# Patient Record
Sex: Male | Born: 1985 | Hispanic: No | Marital: Single | Smoking: Current some day smoker
Health system: Southern US, Community
[De-identification: ages and names within clinical notes are randomized; demographics above are authoritative.]

---

## 2013-04-29 ENCOUNTER — Emergency Department (HOSPITAL_COMMUNITY)
Admission: EM | Admit: 2013-04-29 | Discharge: 2013-04-29 | Disposition: A | Discharge status: Home / Self Care | Payer: Self-pay | Attending: Emergency Medicine | Admitting: Emergency Medicine

## 2013-04-29 ENCOUNTER — Encounter (HOSPITAL_COMMUNITY): Payer: Self-pay | Admitting: Emergency Medicine

## 2013-04-29 DIAGNOSIS — F17200 Nicotine dependence, unspecified, uncomplicated: Secondary | ICD-10-CM | POA: Insufficient documentation

## 2013-04-29 DIAGNOSIS — R300 Dysuria: Secondary | ICD-10-CM | POA: Insufficient documentation

## 2013-04-29 DIAGNOSIS — R1110 Vomiting, unspecified: Secondary | ICD-10-CM | POA: Insufficient documentation

## 2013-04-29 DIAGNOSIS — K219 Gastro-esophageal reflux disease without esophagitis: Secondary | ICD-10-CM | POA: Insufficient documentation

## 2013-04-29 LAB — URINALYSIS, ROUTINE W REFLEX MICROSCOPIC
Bilirubin Urine: NEGATIVE
Glucose, UA: NEGATIVE mg/dL
Hgb urine dipstick: NEGATIVE
Ketones, ur: 15 mg/dL — AB
Protein, ur: NEGATIVE mg/dL

## 2013-04-29 LAB — CBC WITH DIFFERENTIAL/PLATELET
Basophils Absolute: 0 10*3/uL (ref 0.0–0.1)
Basophils Relative: 1 % (ref 0–1)
Eosinophils Absolute: 0.3 10*3/uL (ref 0.0–0.7)
HCT: 45.1 % (ref 39.0–52.0)
Hemoglobin: 15.8 g/dL (ref 13.0–17.0)
MCH: 30.9 pg (ref 26.0–34.0)
MCHC: 35 g/dL (ref 30.0–36.0)
Monocytes Absolute: 0.4 10*3/uL (ref 0.1–1.0)
Monocytes Relative: 7 % (ref 3–12)
RDW: 12.8 % (ref 11.5–15.5)

## 2013-04-29 LAB — COMPREHENSIVE METABOLIC PANEL
ALT: 18 U/L (ref 0–53)
AST: 21 U/L (ref 0–37)
Alkaline Phosphatase: 78 U/L (ref 39–117)
CO2: 26 mEq/L (ref 19–32)
Calcium: 9.6 mg/dL (ref 8.4–10.5)
GFR calc non Af Amer: >90 mL/min (ref >90)
Glucose, Bld: 98 mg/dL (ref 70–99)
Potassium: 4.7 mEq/L (ref 3.5–5.1)
Sodium: 138 mEq/L (ref 135–145)
Total Protein: 7.4 g/dL (ref 6.0–8.3)

## 2013-04-29 MED ORDER — PANTOPRAZOLE SODIUM 40 MG PO TBEC
40.0000 mg | DELAYED_RELEASE_TABLET | Freq: Once | ORAL | Status: AC
  Administered 2013-04-29: 40 mg via ORAL
  Filled 2013-04-29: qty 1

## 2013-04-29 MED ORDER — PANTOPRAZOLE SODIUM 20 MG PO TBEC: 20.0000 mg | DELAYED_RELEASE_TABLET | Freq: Every day | ORAL | Status: DC

## 2013-04-29 MED ORDER — GI COCKTAIL ~~LOC~~
30.0000 mL | Freq: Once | ORAL | Status: AC
  Administered 2013-04-29: 30 mL via ORAL
  Filled 2013-04-29: qty 30

## 2013-04-29 NOTE — ED Notes (Signed)
Pt c/o abd pain intermittently x 1 month after eating spicy foods or drinking coffee; pt sts vomited this am

## 2013-04-29 NOTE — ED Notes (Signed)
Pt requested dr to evaluate back pain that has been going on for about 4 years now. PA Szekalski at bedside now.

## 2013-04-29 NOTE — ED Provider Notes (Signed)
History     CSN: [id number]  Arrival date & time 04/29/13  0940   First MD Initiated Contact with Patient 04/29/13 1114      Chief Complaint  Patient presents with  . Abdominal Pain  . Emesis    (Consider location/radiation/quality/duration/timing/severity/associated sxs/prior treatment) HPI Comments: Patient is a 27 year old male with no significant past medical history who presents with a 1 month history of abdominal pain. The pain is located in the epigastrium and does not radiate. The pain is described as aching and severe when present. The pain is intermittent and precipitated by drinking coffee or eating spicy foods. No alleviating factors. The patient has tried nothing for symptoms without relief. Associated symptoms include dysuria and vomiting x1 this morning after drinking coffee. Patient denies fever, headache, NVD, chest pain, SOB, constipation. Patient reports having dysuria previously and was diagnosed with a UTI. Patient states he has also has acid reflux before which feels like his current symptoms. No abdominal surgical history.     Patient is a 27 y.o. male presenting with abdominal pain and vomiting.  Abdominal Pain Associated symptoms include abdominal pain and vomiting.  Emesis Associated symptoms: abdominal pain     History reviewed. No pertinent past medical history.  History reviewed. No pertinent past surgical history.  History reviewed. No pertinent family history.  History  Substance Use Topics  . Smoking status: Current Every Day Smoker  . Smokeless tobacco: Not on file  . Alcohol Use: Yes     Comment: occ      Review of Systems  Gastrointestinal: Positive for vomiting and abdominal pain.  All other systems reviewed and are negative.    Allergies  Review of patient's allergies indicates no known allergies.  Home Medications  No current outpatient prescriptions on file.  BP 133/74  Pulse 62  Temp(Src) 98.5 F (36.9 C) (Oral)  Resp  18  SpO2 97%  Physical Exam  Nursing note and vitals reviewed. Constitutional: He is oriented to person, place, and time. He appears well-developed and well-nourished. No distress.  HENT:  Head: Normocephalic and atraumatic.  Eyes: Conjunctivae and EOM are normal. No scleral icterus.  Neck: Normal range of motion.  Cardiovascular: Normal rate and regular rhythm.  Exam reveals no gallop and no friction rub.   No murmur heard. Pulmonary/Chest: Effort normal and breath sounds normal. He has no wheezes. He has no rales. He exhibits no tenderness.  Abdominal: Soft. He exhibits no distension. There is no tenderness. There is no rebound and no guarding.  No focal abdominal tenderness to palpation. No peritoneal signs.   Musculoskeletal: Normal range of motion.  Neurological: He is alert and oriented to person, place, and time. Coordination normal.  Speech is goal-oriented. Moves limbs without ataxia.   Skin: Skin is warm and dry.  Psychiatric: He has a normal mood and affect. His behavior is normal.    ED Course  Procedures (including critical care time)  Labs Reviewed  URINALYSIS, ROUTINE W REFLEX MICROSCOPIC - Abnormal; Notable for the following:    Ketones, ur 15 (*)    All other components within normal limits  COMPREHENSIVE METABOLIC PANEL  CBC WITH DIFFERENTIAL  LIPASE, BLOOD   No results found.   1. Acid reflux       MDM  11:46 AM Labs and urinalysis pending. Vitals stable and patient afebrile. Patient likely has acid reflux and UTI, per history provided. Will wait for labs to result to see if other process is present.  Patient will have GI cocktail and protonix now.   12:52 PM Labs unremarkable for acute changes. Urinalysis shows no signs of infections. Patient denies new sexual contacts. Patient likely has acid reflux. I will discharge the patient with prescription for daily protonix and instructions to follow up with a PCP. Patient instructed tor return with worsening  or concerning symptoms.     Emilia Beck, PA-C 04/29/13 1341

## 2013-04-30 NOTE — ED Provider Notes (Signed)
Medical screening examination/treatment/procedure(s) were performed by non-physician practitioner and as supervising physician I was immediately available for consultation/collaboration.   Carleene Cooper III, MD 04/30/13 1037

## 2015-07-30 ENCOUNTER — Emergency Department (HOSPITAL_COMMUNITY)
Admission: EM | Admit: 2015-07-30 | Discharge: 2015-07-31 | Disposition: A | Discharge status: Home / Self Care | Payer: Self-pay | Attending: Emergency Medicine | Admitting: Emergency Medicine

## 2015-07-30 DIAGNOSIS — T782XXA Anaphylactic shock, unspecified, initial encounter: Secondary | ICD-10-CM | POA: Insufficient documentation

## 2015-07-30 DIAGNOSIS — Z79899 Other long term (current) drug therapy: Secondary | ICD-10-CM | POA: Insufficient documentation

## 2015-07-30 DIAGNOSIS — Y9389 Activity, other specified: Secondary | ICD-10-CM | POA: Insufficient documentation

## 2015-07-30 DIAGNOSIS — X58XXXA Exposure to other specified factors, initial encounter: Secondary | ICD-10-CM | POA: Insufficient documentation

## 2015-07-30 DIAGNOSIS — T63484A Toxic effect of venom of other arthropod, undetermined, initial encounter: Secondary | ICD-10-CM

## 2015-07-30 DIAGNOSIS — Z720 Tobacco use: Secondary | ICD-10-CM | POA: Insufficient documentation

## 2015-07-30 DIAGNOSIS — Y9289 Other specified places as the place of occurrence of the external cause: Secondary | ICD-10-CM | POA: Insufficient documentation

## 2015-07-30 DIAGNOSIS — Y998 Other external cause status: Secondary | ICD-10-CM | POA: Insufficient documentation

## 2015-07-30 LAB — CBC
HCT: 48.5 % (ref 39.0–52.0)
HEMOGLOBIN: 17.2 g/dL — AB (ref 13.0–17.0)
MCH: 31.4 pg (ref 26.0–34.0)
MCHC: 35.5 g/dL (ref 30.0–36.0)
MCV: 88.5 fL (ref 78.0–100.0)
Platelets: 237 10*3/uL (ref 150–400)
RBC: 5.48 MIL/uL (ref 4.22–5.81)
RDW: 12.8 % (ref 11.5–15.5)
WBC: 12.7 10*3/uL — ABNORMAL HIGH (ref 4.0–10.5)

## 2015-07-30 LAB — I-STAT CHEM 8, ED
BUN: 15 mg/dL (ref 6–20)
CALCIUM ION: 1.07 mmol/L — AB (ref 1.12–1.23)
CHLORIDE: 104 mmol/L (ref 101–111)
CREATININE: 1.1 mg/dL (ref 0.61–1.24)
GLUCOSE: 134 mg/dL — AB (ref 65–99)
HCT: 51 % (ref 39.0–52.0)
Hemoglobin: 17.3 g/dL — ABNORMAL HIGH (ref 13.0–17.0)
Potassium: 2.9 mmol/L — ABNORMAL LOW (ref 3.5–5.1)
Sodium: 139 mmol/L (ref 135–145)
TCO2: 20 mmol/L (ref 0–100)

## 2015-07-30 MED ORDER — DIPHENHYDRAMINE HCL 50 MG/ML IJ SOLN
25.0000 mg | Freq: Once | INTRAMUSCULAR | Status: AC
  Administered 2015-07-30: 25 mg via INTRAVENOUS
  Filled 2015-07-30: qty 1

## 2015-07-30 MED ORDER — EPINEPHRINE 0.3 MG/0.3ML IJ SOAJ
0.3000 mg | Freq: Once | INTRAMUSCULAR | Status: AC
  Administered 2015-07-30: 0.3 mg via INTRAMUSCULAR
  Filled 2015-07-30: qty 0.3

## 2015-07-30 MED ORDER — FAMOTIDINE IN NACL 20-0.9 MG/50ML-% IV SOLN
20.0000 mg | Freq: Once | INTRAVENOUS | Status: AC
  Administered 2015-07-30: 20 mg via INTRAVENOUS

## 2015-07-30 MED ORDER — SODIUM CHLORIDE 0.9 % IV SOLN
1000.0000 mL | INTRAVENOUS | Status: DC
  Administered 2015-07-30: 1000 mL via INTRAVENOUS

## 2015-07-30 MED ORDER — METHYLPREDNISOLONE SODIUM SUCC 125 MG IJ SOLR
125.0000 mg | Freq: Once | INTRAMUSCULAR | Status: AC
  Administered 2015-07-30: 125 mg via INTRAVENOUS
  Filled 2015-07-30: qty 2

## 2015-07-30 MED ORDER — EPINEPHRINE 0.3 MG/0.3ML IJ SOAJ
0.3000 mg | Freq: Once | INTRAMUSCULAR | Status: AC
  Administered 2015-07-30: 0.3 mg via INTRAMUSCULAR

## 2015-07-30 NOTE — ED Notes (Addendum)
The pt arrivd at triage with a bee sting to his lower lip approx 30 minutes before he arrived.  On arrival generalized flush  C/o difficulty breathing .  Feeling like he was swollen in the back of his thrioat generalized hives.  Itching all over his body facial features swollen.  bp low and he is c/o sl upper lt chest pain.  Epi pen injected iv placed

## 2015-07-30 NOTE — ED Provider Notes (Signed)
CSN: [id number]     Arrival date & time 07/30/15  2040 History   First MD Initiated Contact with Patient 07/30/15 2052     Chief Complaint  Patient presents with  . Allergic Reaction   HPI Patient presents to the emergency room after a bee sting on the lip. This occurred shortly before arrival. Patient subsequently developed swelling of his lip and his eyes. He started developing itching throughout his entire body and developed redness and hives. He has been stung in the past but never has had a reaction like this. As any trouble with chest pain or shortness of breath. No trouble swallowing or breathing. No past medical history on file. No past surgical history on file. No family history on file. Social History  Substance Use Topics  . Smoking status: Current Every Day Smoker  . Smokeless tobacco: Not on file  . Alcohol Use: Yes     Comment: occ    Review of Systems  All other systems reviewed and are negative.     Allergies  Bee venom  Home Medications   Prior to Admission medications   Medication Sig Start Date End Date Taking? Authorizing Provider  pantoprazole (PROTONIX) 20 MG tablet Take 1 tablet (20 mg total) by mouth daily. 04/29/13   Kaitlyn Szekalski, PA-C   BP 118/68 mmHg  Pulse 95  Resp 25  Ht  (1.676 m)  Wt 195 lb (88.451 kg)  BMI 31.49 kg/m2  SpO2 96% Physical Exam  Constitutional: He appears well-developed and well-nourished. No distress.  HENT:  Head: Normocephalic.  Right Ear: External ear normal.  Left Ear: External ear normal.  Mouth/Throat: No oropharyngeal exudate.  No oropharyngeal edema, periorbital edema and edema of the lips  Eyes: Conjunctivae are normal. Right eye exhibits no discharge. Left eye exhibits no discharge. No scleral icterus.  Neck: Neck supple. No tracheal deviation present.  Cardiovascular: Normal rate, regular rhythm and intact distal pulses.   Pulmonary/Chest: Effort normal and breath sounds normal. No stridor. No  respiratory distress. He has no wheezes. He has no rales.  Abdominal: Soft. Bowel sounds are normal. He exhibits no distension. There is no tenderness. There is no rebound and no guarding.  Musculoskeletal: He exhibits no edema or tenderness.  Neurological: He is alert. He has normal strength. No cranial nerve deficit (no facial droop, extraocular movements intact, no slurred speech) or sensory deficit. He exhibits normal muscle tone. He displays no seizure activity. Coordination normal.  Skin: Skin is warm and dry. Rash noted.  Diffuse erythema of the skin with urticaria on his torso and extremities  Psychiatric: He has a normal mood and affect.  Nursing note and vitals reviewed.   ED Course  Procedures (including critical care time)  CRITICAL CARE Performed by: WUJWJ,XBJ Total critical care time: 40 Critical care time was exclusive of separately billable procedures and treating other patients. Critical care was necessary to treat or prevent imminent or life-threatening deterioration. Critical care was time spent personally by me on the following activities: development of treatment plan with patient and/or surrogate as well as nursing, discussions with consultants, evaluation of patient's response to treatment, examination of patient, obtaining history from patient or surrogate, ordering and performing treatments and interventions, ordering and review of laboratory studies, ordering and review of radiographic studies, pulse oximetry and re-evaluation of patient's condition.  Labs Review Labs Reviewed  CBC - Abnormal; Notable for the following:    WBC 12.7 (*)    Hemoglobin 17.2 (*)  All other components within normal limits  I-STAT CHEM 8, ED - Abnormal; Notable for the following:    Potassium 2.9 (*)    Glucose, Bld 134 (*)    Calcium, Ion 1.07 (*)    Hemoglobin 17.3 (*)    All other components within normal limits    Medications  0.9 %  sodium chloride infusion (0 mLs  Intravenous Stopped 07/30/15 2301)  EPINEPHrine (EPI-PEN) injection 0.3 mg (0.3 mg Intramuscular Given 07/30/15 2045)  famotidine (PEPCID) IVPB 20 mg premix (0 mg Intravenous Stopped 07/30/15 2058)  methylPREDNISolone sodium succinate (SOLU-MEDROL) 125 mg/2 mL injection 125 mg (125 mg Intravenous Given 07/30/15 2057)  diphenhydrAMINE (BENADRYL) injection 25 mg (25 mg Intravenous Given 07/30/15 2057)  diphenhydrAMINE (BENADRYL) injection 25 mg (25 mg Intravenous Given 07/30/15 2257)  EPINEPHrine (EPI-PEN) injection 0.3 mg (0.3 mg Intramuscular Given 07/30/15 2316)     MDM   Final diagnoses:  Anaphylaxis due to hymenoptera venom, undetermined intent, initial encounter    Pt presented with an anaphylactic reaction.  Initially given epinephrine steroids and antihistamines.  He had some improvement but developed recurrent urticaria and ithcing.  2nd dose of epi given but no airway sx.  Will continue to monitor in the ED.  Anticipate discharge.  Dr Judd Lien will follow up on the patient.    Linwood Dibbles, MD 07/31/15 [phone]

## 2015-07-30 NOTE — ED Notes (Signed)
Dr.Knapp at bedside  

## 2015-07-30 NOTE — ED Notes (Signed)
Pt was stung on lip by a bee. Pt having difficulty speaking with swollen lips and eyes.

## 2015-07-30 NOTE — ED Notes (Signed)
Epi pen given at 2045

## 2015-07-31 MED ORDER — PREDNISONE 10 MG PO TABS: 20.0000 mg | ORAL_TABLET | Freq: Two times a day (BID) | ORAL | Status: DC

## 2015-07-31 MED ORDER — EPINEPHRINE 0.3 MG/0.3ML IJ SOAJ: 0.3000 mg | Freq: Once | INTRAMUSCULAR | Status: AC

## 2015-07-31 MED ORDER — PREDNISONE 10 MG PO TABS: 20.0000 mg | ORAL_TABLET | Freq: Two times a day (BID) | ORAL | Status: AC

## 2015-07-31 NOTE — Discharge Instructions (Signed)
Prednisone as prescribed.  Benadryl 25 mg every 6 hours for the next 2 days.  EpiPen as needed if you react this way to a bee sting in the future.  Return to the ER if symptoms significantly worsen or change.   Allergies Allergies may happen from anything your body is sensitive to. This may be food, medicines, pollens, chemicals, and nearly anything around you in everyday life that produces allergens. An allergen is anything that causes an allergy producing substance. Heredity is often a factor in causing these problems. This means you may have some of the same allergies as your parents. Food allergies happen in all age groups. Food allergies are some of the most severe and life threatening. Some common food allergies are cow's milk, seafood, eggs, nuts, wheat, and soybeans. SYMPTOMS   Swelling around the mouth.  An itchy red rash or hives.  Vomiting or diarrhea.  Difficulty breathing. SEVERE ALLERGIC REACTIONS ARE LIFE-THREATENING. This reaction is called anaphylaxis. It can cause the mouth and throat to swell and cause difficulty with breathing and swallowing. In severe reactions only a trace amount of food (for example, peanut oil in a salad) may cause death within seconds. Seasonal allergies occur in all age groups. These are seasonal because they usually occur during the same season every year. They may be a reaction to molds, grass pollens, or tree pollens. Other causes of problems are house dust mite allergens, pet dander, and mold spores. The symptoms often consist of nasal congestion, a runny itchy nose associated with sneezing, and tearing itchy eyes. There is often an associated itching of the mouth and ears. The problems happen when you come in contact with pollens and other allergens. Allergens are the particles in the air that the body reacts to with an allergic reaction. This causes you to release allergic antibodies. Through a chain of events, these eventually cause you to  release histamine into the blood stream. Although it is meant to be protective to the body, it is this release that causes your discomfort. This is why you were given anti-histamines to feel better. If you are unable to pinpoint the offending allergen, it may be determined by skin or blood testing. Allergies cannot be cured but can be controlled with medicine. Hay fever is a collection of all or some of the seasonal allergy problems. It may often be treated with simple over-the-counter medicine such as diphenhydramine. Take medicine as directed. Do not drink alcohol or drive while taking this medicine. Check with your caregiver or package insert for child dosages. If these medicines are not effective, there are many new medicines your caregiver can prescribe. Stronger medicine such as nasal spray, eye drops, and corticosteroids may be used if the first things you try do not work well. Other treatments such as immunotherapy or desensitizing injections can be used if all else fails. Follow up with your caregiver if problems continue. These seasonal allergies are usually not life threatening. They are generally more of a nuisance that can often be handled using medicine. HOME CARE INSTRUCTIONS   If unsure what causes a reaction, keep a diary of foods eaten and symptoms that follow. Avoid foods that cause reactions.  If hives or rash are present:  Take medicine as directed.  You may use an over-the-counter antihistamine (diphenhydramine) for hives and itching as needed.  Apply cold compresses (cloths) to the skin or take baths in cool water. Avoid hot baths or showers. Heat will make a rash and itching worse.  If you are severely allergic:  Following a treatment for a severe reaction, hospitalization is often required for closer follow-up.  Wear a medic-alert bracelet or necklace stating the allergy.  You and your family must learn how to give adrenaline or use an anaphylaxis kit.  If you have  had a severe reaction, always carry your anaphylaxis kit or EpiPen with you. Use this medicine as directed by your caregiver if a severe reaction is occurring. Failure to do so could have a fatal outcome. SEEK MEDICAL CARE IF:  You suspect a food allergy. Symptoms generally happen within 30 minutes of eating a food.  Your symptoms have not gone away within 2 days or are getting worse.  You develop new symptoms.  You want to retest yourself or your child with a food or drink you think causes an allergic reaction. Never do this if an anaphylactic reaction to that food or drink has happened before. Only do this under the care of a caregiver. SEEK IMMEDIATE MEDICAL CARE IF:   You have difficulty breathing, are wheezing, or have a tight feeling in your chest or throat.  You have a swollen mouth, or you have hives, swelling, or itching all over your body.  You have had a severe reaction that has responded to your anaphylaxis kit or an EpiPen. These reactions may return when the medicine has worn off. These reactions should be considered life threatening. MAKE SURE YOU:   Understand these instructions.  Will watch your condition.  Will get help right away if you are not doing well or get worse. Document Released: 01/29/2003 Document Revised: 03/02/2013 Document Reviewed: 07/05/2008 [facility] Patient Information 2015 [place], [place]. This information is not intended to replace advice given to you by your health care provider. Make sure you discuss any questions you have with your health care provider.

## 2015-07-31 NOTE — ED Provider Notes (Signed)
Care assumed from Dr. Lynelle Doctor at shift change. Patient was stung by a bee and had an anaphylactic reaction. He is been given 2 doses of epinephrine along with intravenous medications. On reassessment, the patient no longer has a rash, his vitals are stable, and he is having no evidence for airway compromise. He will be discharged to home with prednisone, Benadryl, and a prescription for an EpiPen.  Jermaine Lyons, MD 07/31/15 [phone]

## 2015-08-01 ENCOUNTER — Emergency Department (HOSPITAL_COMMUNITY): Payer: Self-pay

## 2015-08-01 ENCOUNTER — Encounter (HOSPITAL_COMMUNITY): Payer: Self-pay | Admitting: Emergency Medicine

## 2015-08-01 DIAGNOSIS — Z720 Tobacco use: Secondary | ICD-10-CM | POA: Insufficient documentation

## 2015-08-01 DIAGNOSIS — R079 Chest pain, unspecified: Secondary | ICD-10-CM | POA: Insufficient documentation

## 2015-08-01 LAB — CBC
HCT: 42.9 % (ref 39.0–52.0)
Hemoglobin: 15 g/dL (ref 13.0–17.0)
MCH: 31.1 pg (ref 26.0–34.0)
MCHC: 35 g/dL (ref 30.0–36.0)
MCV: 88.8 fL (ref 78.0–100.0)
PLATELETS: 214 10*3/uL (ref 150–400)
RBC: 4.83 MIL/uL (ref 4.22–5.81)
RDW: 12.8 % (ref 11.5–15.5)
WBC: 7.4 10*3/uL (ref 4.0–10.5)

## 2015-08-01 LAB — BASIC METABOLIC PANEL
Anion gap: 10 (ref 5–15)
BUN: 13 mg/dL (ref 6–20)
CALCIUM: 8.7 mg/dL — AB (ref 8.9–10.3)
CHLORIDE: 100 mmol/L — AB (ref 101–111)
CO2: 24 mmol/L (ref 22–32)
CREATININE: 0.99 mg/dL (ref 0.61–1.24)
GFR calc non Af Amer: >60 mL/min (ref >60)
Glucose, Bld: 131 mg/dL — ABNORMAL HIGH (ref 65–99)
Potassium: 3.6 mmol/L (ref 3.5–5.1)
SODIUM: 134 mmol/L — AB (ref 135–145)

## 2015-08-01 NOTE — ED Notes (Signed)
Patient here with complaint of left chest pain starting 3 days ago. States he drank some pineapple juice, then had another drink, then the pain started. Denies history of similar.

## 2015-08-02 ENCOUNTER — Emergency Department (HOSPITAL_COMMUNITY)
Admission: EM | Admit: 2015-08-02 | Discharge: 2015-08-02 | Discharge status: Against Medical Advice | Payer: Self-pay | Attending: Emergency Medicine | Admitting: Emergency Medicine

## 2015-08-02 ENCOUNTER — Emergency Department (HOSPITAL_COMMUNITY)
Admission: EM | Admit: 2015-08-02 | Discharge: 2015-08-02 | Disposition: A | Discharge status: Home / Self Care | Payer: Self-pay | Attending: Emergency Medicine | Admitting: Emergency Medicine

## 2015-08-02 ENCOUNTER — Emergency Department (HOSPITAL_COMMUNITY): Payer: Self-pay

## 2015-08-02 ENCOUNTER — Encounter (HOSPITAL_COMMUNITY): Payer: Self-pay | Admitting: Emergency Medicine

## 2015-08-02 DIAGNOSIS — Z7952 Long term (current) use of systemic steroids: Secondary | ICD-10-CM | POA: Insufficient documentation

## 2015-08-02 DIAGNOSIS — R0789 Other chest pain: Secondary | ICD-10-CM | POA: Insufficient documentation

## 2015-08-02 DIAGNOSIS — Z720 Tobacco use: Secondary | ICD-10-CM | POA: Insufficient documentation

## 2015-08-02 LAB — I-STAT TROPONIN, ED: TROPONIN I, POC: 0 ng/mL (ref 0.00–0.08)

## 2015-08-02 LAB — BASIC METABOLIC PANEL
Anion gap: 8 (ref 5–15)
BUN: 10 mg/dL (ref 6–20)
CHLORIDE: 103 mmol/L (ref 101–111)
CO2: 23 mmol/L (ref 22–32)
CREATININE: 0.9 mg/dL (ref 0.61–1.24)
Calcium: 9 mg/dL (ref 8.9–10.3)
GFR calc Af Amer: >60 mL/min (ref >60)
GFR calc non Af Amer: >60 mL/min (ref >60)
GLUCOSE: 110 mg/dL — AB (ref 65–99)
POTASSIUM: 3.7 mmol/L (ref 3.5–5.1)
SODIUM: 134 mmol/L — AB (ref 135–145)

## 2015-08-02 LAB — CBC
HCT: 46 % (ref 39.0–52.0)
Hemoglobin: 16 g/dL (ref 13.0–17.0)
MCH: 30.8 pg (ref 26.0–34.0)
MCHC: 34.8 g/dL (ref 30.0–36.0)
MCV: 88.6 fL (ref 78.0–100.0)
Platelets: 237 10*3/uL (ref 150–400)
RBC: 5.19 MIL/uL (ref 4.22–5.81)
RDW: 12.7 % (ref 11.5–15.5)
WBC: 7.4 10*3/uL (ref 4.0–10.5)

## 2015-08-02 MED ORDER — IBUPROFEN 800 MG PO TABS: 800.0000 mg | ORAL_TABLET | Freq: Three times a day (TID) | ORAL | Status: AC

## 2015-08-02 NOTE — Discharge Instructions (Signed)
Please take Ibuprofen for pain relief as prescribed. Please follow up with a Primary care provider listed in the Resource Guide provided below in 3 days. Please return to the Emergency Department if symptoms worsen or new onset of fever, headache, shortness of breath, vomiting, weakness.   Emergency Department Resource Guide 1) Find a Doctor and Pay Out of Pocket Although you won't have to find out who is covered by your insurance plan, it is a good idea to ask around and get recommendations. You will then need to call the office and see if the doctor you have chosen will accept you as a new patient and what types of options they offer for patients who are self-pay. Some doctors offer discounts or will set up payment plans for their patients who do not have insurance, but you will need to ask so you aren't surprised when you get to your appointment.  2) Contact Your Local Health Department Not all health departments have doctors that can see patients for sick visits, but many do, so it is worth a call to see if yours does. If you don't know where your local health department is, you can check in your phone book. The CDC also has a tool to help you locate your state's health department, and many state websites also have listings of all of their local health departments.  3) Find a Walk-in Clinic If your illness is not likely to be very severe or complicated, you may want to try a walk in clinic. These are popping up all over the country in pharmacies, drugstores, and shopping centers. They're usually staffed by nurse practitioners or physician assistants that have been trained to treat common illnesses and complaints. They're usually fairly quick and inexpensive. However, if you have serious medical issues or chronic medical problems, these are probably not your best option.  No Primary Care Doctor: - Call Health Connect at  [phone] - they can help you locate a primary care doctor that  accepts your  insurance, provides certain services, etc. - Physician Referral Service- [phone]  Chronic Pain Problems: Organization         Address  Phone   Notes  [facility]  [phone] Patients need to be referred by their primary care doctor.   Medication Assistance: Organization         Address  Phone   Notes  [facility] [address] [place] [place], [place] [address] [phone] --Must be a resident of [facility] -- Must have NO insurance coverage whatsoever (no Medicaid/ Medicare, etc.) -- The pt. MUST have a primary care doctor that directs their care regularly and follows them in the community   [facility]ist  [phone]   [employer]  [phone]    Agencies that provide inexpensive medical care: Organization         Address  Phone   Notes  [facility]  [phone]   [facility]    [phone]   [facility] [address] [place], [place] [address] [phone]   [facility] [place] [address] [place] [place] [phone]   Planned Parenthood    [phone]   [facility]    [phone]   [facility]  [address], [place] Phone:  [phone], Fax:  [phone] Hours of Operation:  9 am - 6 pm,  M-F.  Also accepts Medicaid/Medicare and self-pay.  [facility] for [place], [address], [place] Phone: [phone], Fax: [phone]. Hours of Operation:  8:30 am - 5:30 pm, M-F.  Also accepts Medicaid and self-pay.  [facility] [place] [address] [place] Phone: [phone]   [place], [place], [place] [phone], Ext. [phone] Mondays & Thursdays: 7-9 AM.  First 15 patients are seen on a first come, first serve basis.    [place] Providers:  Organization          Address  Phone   Notes  [facility] [address],  [phone] Also accepts self-pay patients.  [facility] [address], [place]  [phone]   [place], [address], [place] [phone]   [facility] [address] [place] [phone]   Lucianne Lei [address], [place]   [phone] Only accepts [place] Access [place] patients after they have their name applied to their card.   Self-Pay (no insurance) in [facility]:  Organization         Address  Phone   Notes  Sickle Cell Patients, [facility] Internal Medicine [place] [phone]   [facility] [place] [phone]   [facility] [place]  [place], [place], [place] [phone]   [facility]/Dr. Osei-Bonsu  [address], [place] or [address], [place] [phone] Phone number for both [place] and [place] locations is the same.  [facility]gent [facility] and [facility] [address] [place] [phone]   [facility] [address] [place] or [address] [phone]   [facility] [address], [address] [phone], phone; [phone], fax Sees patients 1st and 3rd Saturday of every month.  Must not qualify for public or private insurance (i.e. Medicaid, Medicare, [place] Health Choice, Veterans' Benefits)  Household income should be no more than 200% of the poverty level The clinic cannot treat you if you are pregnant or think you are pregnant  Sexually transmitted diseases are not treated at the clinic.    Dental Care: Organization         Address  Phone  Notes  [facility] Department of [facility] [place] [phone] Accepts children up to age 58 who are enrolled in [place] or [place]; pregnant women with a Medicaid card; and children who have applied for Medicaid or [place] Health Choice, but were declined, whose parents can pay a reduced fee at time of service.  [facility] Department of [facility]  [address], [place] [phone] Accepts children up to age 76 who are enrolled in [place] or [place]; pregnant women with a Medicaid card; and children who have applied for Medicaid or  Health Choice, but were declined, whose parents can pay a reduced fee at time of service.  [place] Adult [facility] Access PRO[facility]  [place] [phone] Patients are seen by appointment only. Walk-ins are not accepted. [place] will see patients 22 years of age and older. Monday -  Tuesday (8am-5pm) Most Wednesdays (8:30-5pm) $30 per visit, cash only  [facility] Adult Dental Access PROGRAM  [address], [facility] [phone] Patients are seen by appointment only. Walk-ins are not accepted. [place] will see patients 74 years of age and older. One Wednesday Evening (Monthly: Volunteer Based).  $30 per visit, cash only  [place]  [phone] for adults; Children under age 63, call Graduate Pediatric Dentistry at [phone]. Children aged 64-14, please call [phone] to request a pediatric application.  Dental services are provided in all areas of dental care including fillings, crowns and bridges, complete and partial dentures, implants, gum treatment, root canals, and extractions. Preventive care is also provided. Treatment is provided to both adults and children. Patients are selected via a lottery and there is often a waiting list.   [facility] [address], [place]  [phone] [email or web address]   [facility] [address] [place], [place]  [phone], Ext. [phone] Second and Fourth Thursday of each month, opens at 6:30 AM; Clinic ends at 9 AM.  Patients are seen on a first-come first-served basis, and a limited number are seen during each clinic.   [facility]  [address] Hillard Danker [place], [place] [phone]   Eligibility Requirements You must have lived in [place], [place], or [place] counties for at least the last three months.   You cannot be eligible for state or federal sponsored [employer], including [employer], [place], or [employer].   You generally cannot be eligible for healthcare insurance through your employer.    How to apply: Eligibility screenings are held every Tuesday and Wednesday afternoon from 1:00 pm until 4:00 pm. You do not need an appointment for the interview!  [facility] [address] [place], [place]   [place]  [place] Department  [place]  [phone]    Behavioral Health Resources in the Community: Intensive Outpatient Programs Organization         Address  Phone  Notes  [facility] [place]. [address] [place], [place] [phone]   [facility] [address] [place], [place]   ADS: Alcohol [facility] Drug Sv[facility] [address] [place], [place]   [place] [address]  [place], [place] or [phone]   Substance Abuse Resources Organization         Address  Phone  Notes  Alcohol and Drug Services  [phone]   [place]  [phone]   [facility] [place]   Chinita Pester  [phone]   Residential & Outpatient Substance Abuse Program  [phone]   Psychological Services Organization         Address  Phone  Notes  [facility] [place]  [place]  [phone]    [place] [address] [place] or [phone]    Mobile Crisis Teams Organization         Address  Phone  Notes  [facility], Mobile Crisis Care Unit  [phone]   [facility]  [address] [place], [place]   [place] [address], [place] [phone]    Self-Help/Support Groups Organization         Address  Phone  Notes  [facility] [place] - variety of support groups  [place] Call for more information  Narcotics Anonymous (NA), [facility] [address], [employer] [place]  2 meetings at this location   Special educational needs teacher         Address  Phone  Notes  [facility]idential [facility] [place],    [place]  [phone]   [facility]  [address], [place] [phone], [place], [place]   [place], [place] [phone] Admissions: 8am-3pm M-F  Incentives Substance [place] [address],    [place], [place] [phone]   [facility] [address] [place], [place], [place]   [facility] [address]  [place], [place]   [facility] Programs - Intensive Outpatient [address] Kristeen [address], [place], [place]   [facility] ([place].,  [place], [place] [phone] or [phone]   [facility] ([facility]S) [address] [place], [place] Accepts Medicaid  [facility] [address],  [place] [phone] Substance Abuse/Addiction Treatment   [facility] Organization         Address  Phone  Notes  [facility]  [phone]   Domenic Schwab, PhD [address] Arlis Porta [place], [place]   [phone] or [phone]   [place]  [place], [place] [phone]   [facility] [address], [place], [place] [phone] Insurance/Medicaid/sponsorship through [facility] and Families [address], [address] [place]                                    [place], [place] [phone] [place] [address], [place] [phone]    Dr. Adele Schilder  [phone]   [facility] Dept. 1) [address] [place] 2) [place] 3)  [place] [address], [place] [phone]  [phone]   [place] [phone] or [phone] (After Hours)

## 2015-08-02 NOTE — ED Provider Notes (Signed)
CSN: [id number]     Arrival date & time 08/02/15  1200 History   First MD Initiated Contact with Patient 08/02/15 1523     Chief Complaint  Patient presents with  . Chest Pain     (Consider location/radiation/quality/duration/timing/severity/associated sxs/prior Treatment) HPI Comments: Pt is a 29 yo male who presents to the ED with complaint of CP, onset 1 week. Pt reports intermittent (pain lasts appx. ) sharp pain located to his left lateral chest wall (point location at central pectorias muscle) that radiates to his left shoulder and left upper arm. He notes the pain improves when he massages his left pec. Endorses associated nausea, and numbness and tingling to his left arm and left face with the CP. Denies fever, chills, diaphoresis, sore throat, SOB, cough, abdominal pain, vomiting, diarrhea, constipation, urinary sxs, weakness, lower extremity swelling. 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    History reviewed. No pertinent past medical history. History reviewed. No pertinent past surgical history. No family history on file. Social History  Substance Use Topics  . Smoking status: Current Some Day Smoker  . Smokeless tobacco: None  . Alcohol Use: Yes     Comment: occ    Review of Systems  Cardiovascular: Positive for chest pain.  Gastrointestinal: Positive for nausea.  Musculoskeletal:       Left arm and shoulder pain.  Neurological: Positive for numbness.  All other systems reviewed and are negative.     Allergies  Bee venom  Home Medications   Prior to Admission medications   Medication Sig Start Date End Date Taking? Authorizing Provider  diphenhydrAMINE (SOMINEX) 25 MG tablet Take 25 mg by mouth every 12 (twelve) hours as needed for itching or sleep.   Yes Historical Provider, MD  EPINEPHrine 0.3 mg/0.3 mL IJ SOAJ injection Inject 0.3 mLs (0.3 mg total) into the muscle once. 07/31/15   Yes Geoffery Lyons, MD  predniSONE (DELTASONE) 10 MG tablet Take 2 tablets (20 mg total) by mouth 2 (two) times daily. 07/31/15  Yes Geoffery Lyons, MD   BP 124/77 mmHg  Pulse 73  Temp(Src) 98.6 F (37 C) (Oral)  Resp 13  SpO2 99% Physical Exam  Constitutional: He is oriented to person, place, and time. He appears well-developed and well-nourished.  HENT:  Head: Normocephalic and atraumatic.  Mouth/Throat: Oropharynx is clear and moist. No oropharyngeal exudate.  Eyes: Conjunctivae and EOM are normal. Pupils are equal, round, and reactive to light. Right eye exhibits no discharge. Left eye exhibits no discharge. No scleral icterus.  Neck: Normal range of motion. Neck supple.  Cardiovascular: Normal rate, regular rhythm, normal heart sounds and intact distal pulses.   No murmur heard. Pulmonary/Chest: Effort normal and breath sounds normal. He has no wheezes. He has no rales. He exhibits tenderness (reproducible pain on palpation of left chest wall at pectorialis major.).  Abdominal: Soft. Bowel sounds are normal. He exhibits no distension and no mass. There is no tenderness. There is no rebound and no guarding.  Musculoskeletal: Normal range of motion. He exhibits no edema or tenderness.       Left shoulder: Normal. He exhibits normal range of motion, no tenderness, no bony tenderness, no swelling, no effusion, no crepitus and no deformity.  5/5 strength of upper extremities, FROM, sensation intact.  Lymphadenopathy:    He has no cervical adenopathy.  Neurological: He is alert and oriented to person, place, and  time. He has normal strength. No cranial nerve deficit or sensory deficit.  Skin: Skin is warm and dry.  Nursing note and vitals reviewed.   ED Course  Procedures (including critical care time) Labs Review Labs Reviewed  BASIC METABOLIC PANEL - Abnormal; Notable for the following:    Sodium 134 (*)    Glucose, Bld 110 (*)    All other components within normal limits  CBC   I-STAT TROPOININ, ED    Imaging Review Dg Chest 2 View  08/02/2015   CLINICAL DATA:  Acute left-sided chest pain for 1 day.  EXAM: CHEST  2 VIEW  COMPARISON:  08/01/2015  FINDINGS: The cardiomediastinal silhouette is unremarkable.  There is no evidence of focal airspace disease, pulmonary edema, suspicious pulmonary nodule/mass, pleural effusion, or pneumothorax. No acute bony abnormalities are identified.  IMPRESSION: No active cardiopulmonary disease.   Electronically Signed   By: Harmon Pier M.D.   On: 08/02/2015 13:18   Dg Chest 2 View  08/01/2015   CLINICAL DATA:  LEFT chest and arm pain tonight with shortness of breath.  EXAM: CHEST  2 VIEW  COMPARISON:  None.  FINDINGS: The heart size and mediastinal contours are within normal limits. Mild peribronchial cuffing. Strandy densities LEFT lung base. The visualized skeletal structures are unremarkable.  IMPRESSION: Mild bronchitic changes with LEFT lung base atelectasis.   Electronically Signed   By: Awilda Metro M.D.   On: 08/01/2015 22:40   I have personally reviewed and evaluated these images and lab results as part of my medical decision-making.   EKG Interpretation None     Filed Vitals:   08/02/15 1612  BP: 124/77  Pulse: 73  Temp:   Resp: 13   Meds given in ED:  Medications - No data to display  New Prescriptions   IBUPROFEN (ADVIL,MOTRIN) 800 MG TABLET    Take 1 tablet (800 mg total) by mouth 3 (three) times daily.     MDM   Final diagnoses:  Musculoskeletal chest pain    Pt presents with intermittent CP, no aggravating or alleviating factors. No prior cardiac history. VSS. During exam pt reports his CP and numbness have resolved. No chest wall tenderness or any swelling, abrasions, contusions noted to chest wall or left arm. FROM, 5/5 strength and sensation intact to left arm. No CN deficits. Troponin 0. EKG shows normal sinus rhythm. CBC, BMP unremarkable. CXR shows no cardiopulmonary disease. I have a low  suspicion for ACS, PE, dissection, or other acute cardiac event at this time, pt does not have any PE risk factors. Presentation most likely due to musculoskeletal etiology. Plan to d/c pt home with nsaids and to folllow up with pcp.   Evaluation does not show pathology requring ongoing emergent intervention or admission. Pt is hemodynamically stable and mentating appropriately. Discussed findings/results and plan with patient/guardian, who agrees with plan. All questions answered. Return precautions discussed and outpatient follow up given.      Satira Sark [place], [place] 08/02/15 1715  Arby Barrette, MD 08/03/15 [phone]

## 2015-08-02 NOTE — ED Notes (Addendum)
To ED via private vehicle with c/o chest pain, "feels funny" was here yesterday, waited 3 hours, then left, said pain comes and goes, left shoulder with radiation to left arm. Had labs drawn yesterday. Also states that left side of tongue and face "feels numb sometimes" -- not now. No weakness or facial droop noted.

## 2015-08-02 NOTE — ED Notes (Signed)
Pt decided he wants to check with his Primary Care Doctor in the morning. Told patient if Pain got worse to please come back in or call 911.

## 2016-02-15 ENCOUNTER — Encounter (HOSPITAL_COMMUNITY): Payer: Self-pay | Admitting: Emergency Medicine

## 2016-02-15 ENCOUNTER — Emergency Department (HOSPITAL_COMMUNITY)
Admission: EM | Admit: 2016-02-15 | Discharge: 2016-02-15 | Disposition: A | Discharge status: Home / Self Care | Payer: Self-pay | Attending: Emergency Medicine | Admitting: Emergency Medicine

## 2016-02-15 ENCOUNTER — Emergency Department (HOSPITAL_COMMUNITY): Payer: Self-pay

## 2016-02-15 DIAGNOSIS — F17200 Nicotine dependence, unspecified, uncomplicated: Secondary | ICD-10-CM | POA: Insufficient documentation

## 2016-02-15 DIAGNOSIS — R0602 Shortness of breath: Secondary | ICD-10-CM | POA: Insufficient documentation

## 2016-02-15 DIAGNOSIS — R110 Nausea: Secondary | ICD-10-CM | POA: Insufficient documentation

## 2016-02-15 DIAGNOSIS — R200 Anesthesia of skin: Secondary | ICD-10-CM | POA: Insufficient documentation

## 2016-02-15 DIAGNOSIS — R079 Chest pain, unspecified: Secondary | ICD-10-CM | POA: Insufficient documentation

## 2016-02-15 LAB — CBC
HEMATOCRIT: 46.9 % (ref 39.0–52.0)
Hemoglobin: 16.2 g/dL (ref 13.0–17.0)
MCH: 30.1 pg (ref 26.0–34.0)
MCHC: 34.5 g/dL (ref 30.0–36.0)
MCV: 87.2 fL (ref 78.0–100.0)
Platelets: 269 10*3/uL (ref 150–400)
RBC: 5.38 MIL/uL (ref 4.22–5.81)
RDW: 12.6 % (ref 11.5–15.5)
WBC: 9.2 10*3/uL (ref 4.0–10.5)

## 2016-02-15 LAB — BASIC METABOLIC PANEL
Anion gap: 10 (ref 5–15)
BUN: 10 mg/dL (ref 6–20)
CALCIUM: 9.4 mg/dL (ref 8.9–10.3)
CO2: 24 mmol/L (ref 22–32)
Chloride: 102 mmol/L (ref 101–111)
Creatinine, Ser: 0.96 mg/dL (ref 0.61–1.24)
GFR calc Af Amer: >60 mL/min (ref >60)
GLUCOSE: 107 mg/dL — AB (ref 65–99)
Potassium: 3.7 mmol/L (ref 3.5–5.1)
Sodium: 136 mmol/L (ref 135–145)

## 2016-02-15 LAB — I-STAT TROPONIN, ED: Troponin i, poc: 0.01 ng/mL (ref 0.00–0.08)

## 2016-02-15 NOTE — ED Notes (Signed)
Pt. reports intermittent central chest pain with SOB , nausea and left hand numbness onset 4 days ago . Denies emesis or diaphoresis .

## 2016-02-15 NOTE — ED Notes (Signed)
Pt's name called to recheck vitals no answer 

## 2016-07-25 IMAGING — CR DG CHEST 2V
2 series · 2 of 2 positions shown · non-contrast
Comparison: None.

CLINICAL DATA: LEFT chest and arm pain tonight with shortness of
breath.

EXAM:
CHEST  2 VIEW

[chest pa]
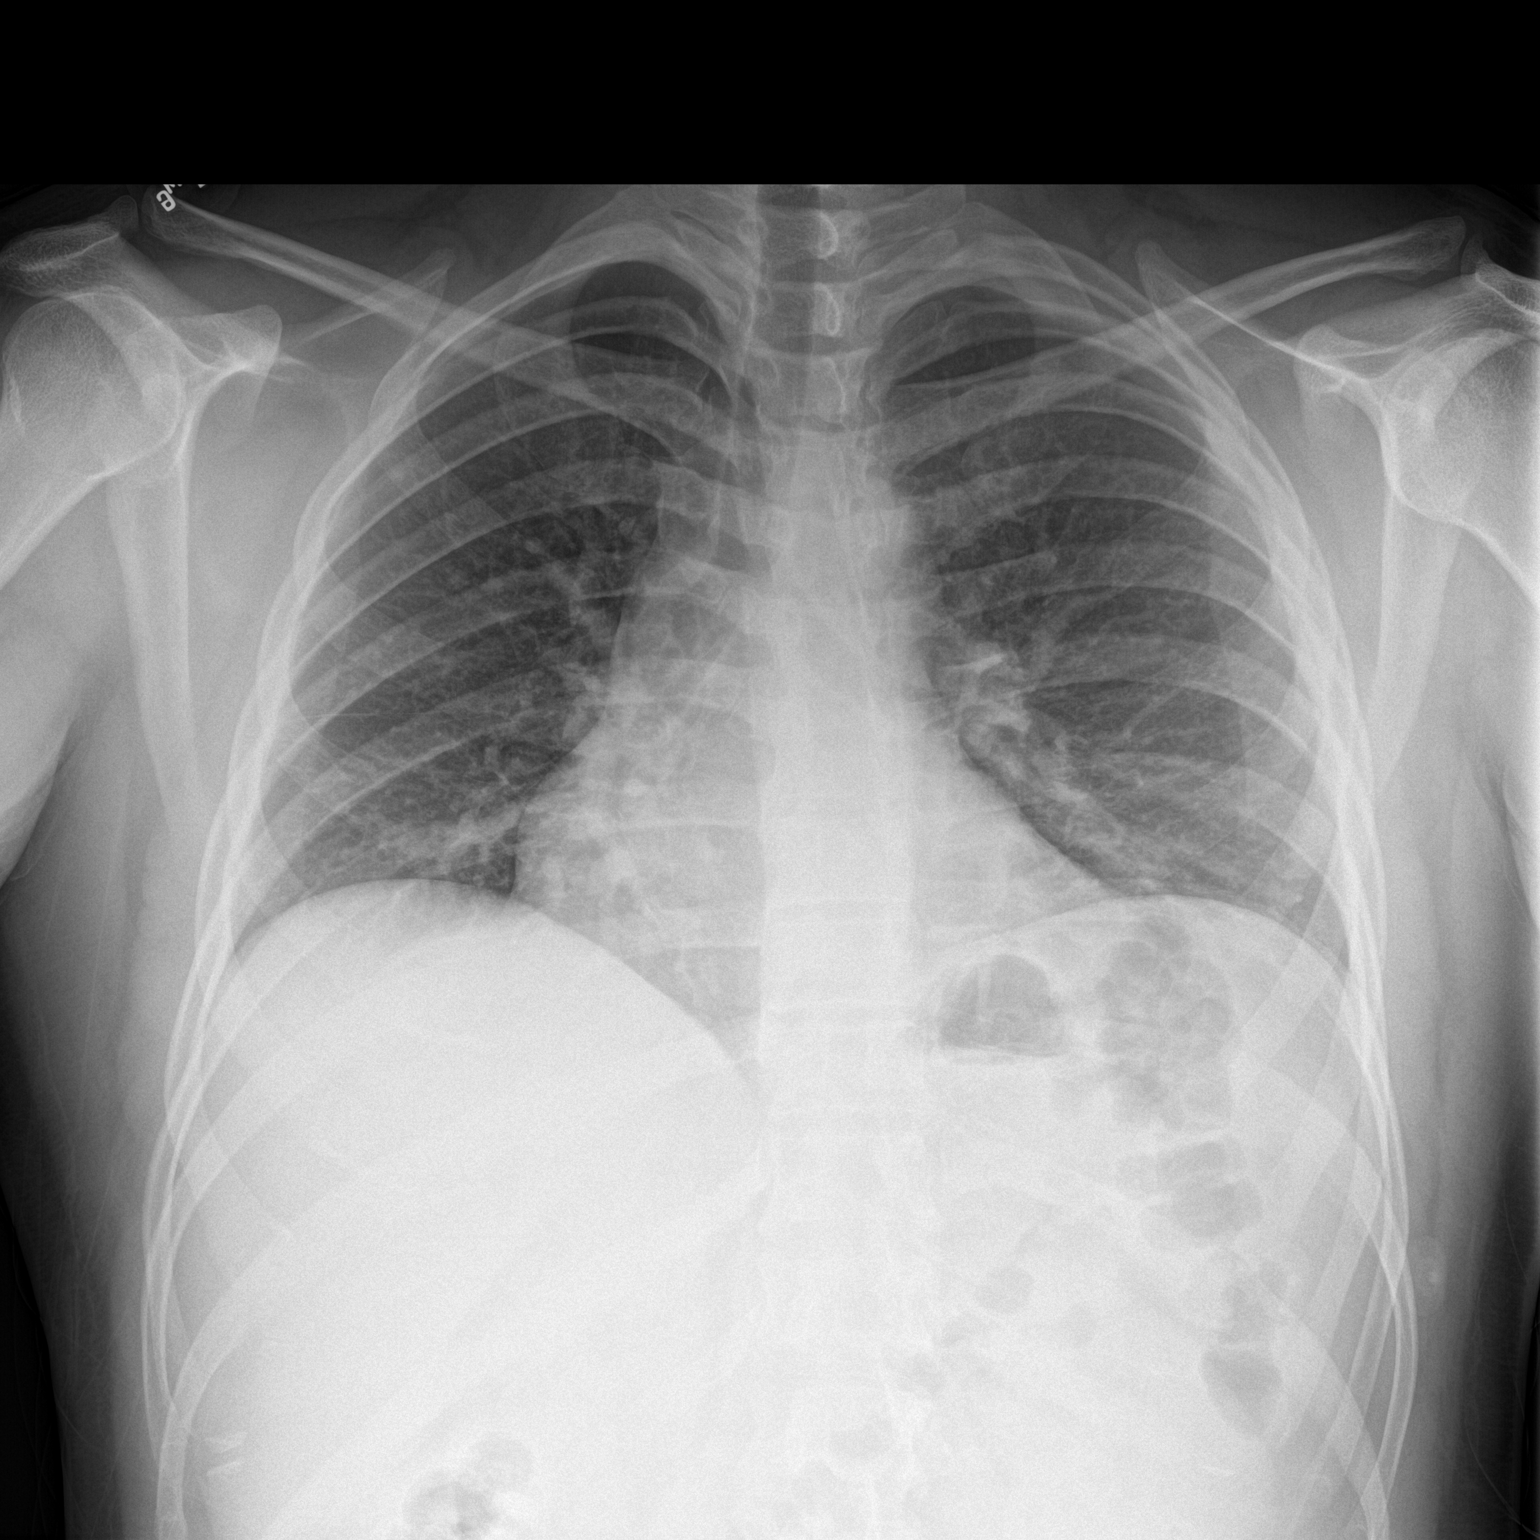

[chest lat]
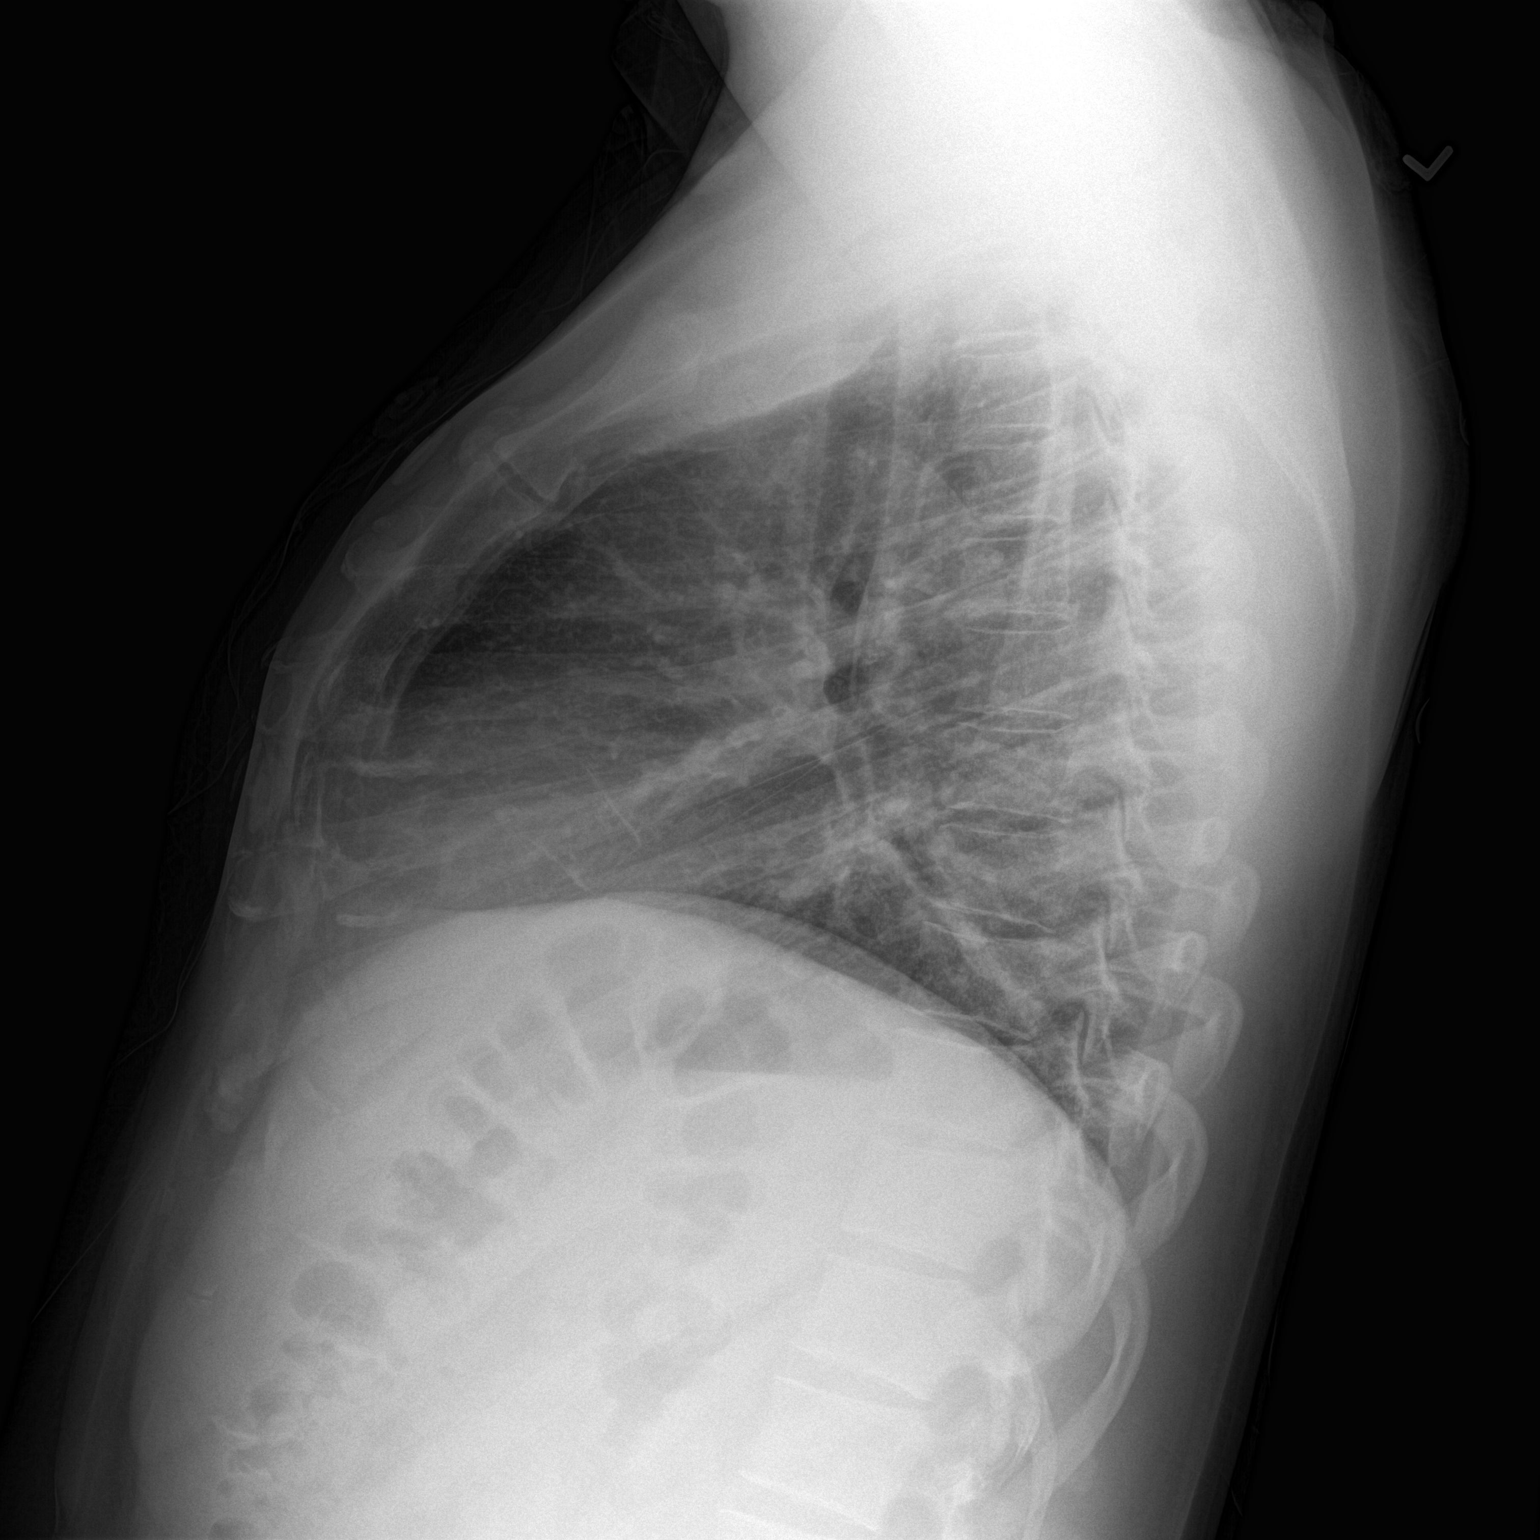

[2 of 2 positions shown; findings below may reference images not displayed]

FINDINGS: The heart size and mediastinal contours are within normal limits.
Mild peribronchial cuffing. Strandy densities LEFT lung base. The
visualized skeletal structures are unremarkable.
IMPRESSION: Mild bronchitic changes with LEFT lung base atelectasis.

## 2017-03-18 ENCOUNTER — Emergency Department (HOSPITAL_COMMUNITY): Payer: Self-pay

## 2017-03-18 ENCOUNTER — Emergency Department (HOSPITAL_COMMUNITY)
Admission: EM | Admit: 2017-03-18 | Discharge: 2017-03-18 | Disposition: A | Discharge status: Home / Self Care | Payer: Self-pay | Attending: Emergency Medicine | Admitting: Emergency Medicine

## 2017-03-18 ENCOUNTER — Encounter (HOSPITAL_COMMUNITY): Payer: Self-pay | Admitting: *Deleted

## 2017-03-18 DIAGNOSIS — R079 Chest pain, unspecified: Secondary | ICD-10-CM

## 2017-03-18 DIAGNOSIS — F17200 Nicotine dependence, unspecified, uncomplicated: Secondary | ICD-10-CM | POA: Insufficient documentation

## 2017-03-18 DIAGNOSIS — R002 Palpitations: Secondary | ICD-10-CM | POA: Insufficient documentation

## 2017-03-18 LAB — BASIC METABOLIC PANEL
Anion gap: 10 (ref 5–15)
BUN: 10 mg/dL (ref 6–20)
CALCIUM: 9.6 mg/dL (ref 8.9–10.3)
CHLORIDE: 99 mmol/L — AB (ref 101–111)
CO2: 25 mmol/L (ref 22–32)
CREATININE: 1.04 mg/dL (ref 0.61–1.24)
GFR calc Af Amer: >60 mL/min (ref >60)
GFR calc non Af Amer: >60 mL/min (ref >60)
GLUCOSE: 109 mg/dL — AB (ref 65–99)
Potassium: 3.8 mmol/L (ref 3.5–5.1)
Sodium: 134 mmol/L — ABNORMAL LOW (ref 135–145)

## 2017-03-18 LAB — CBC
HCT: 50.8 % (ref 39.0–52.0)
Hemoglobin: 17.8 g/dL — ABNORMAL HIGH (ref 13.0–17.0)
MCH: 30.7 pg (ref 26.0–34.0)
MCHC: 35 g/dL (ref 30.0–36.0)
MCV: 87.6 fL (ref 78.0–100.0)
PLATELETS: 238 10*3/uL (ref 150–400)
RBC: 5.8 MIL/uL (ref 4.22–5.81)
RDW: 13.1 % (ref 11.5–15.5)
WBC: 10.6 10*3/uL — ABNORMAL HIGH (ref 4.0–10.5)

## 2017-03-18 LAB — I-STAT TROPONIN, ED: TROPONIN I, POC: 0 ng/mL (ref 0.00–0.08)

## 2017-03-18 NOTE — ED Notes (Addendum)
Pt refused wheelchair to room, and ambulated from waiting area. Pt had no complaints while ambulating.

## 2017-03-18 NOTE — ED Triage Notes (Signed)
Pt c/o L sided CP intermittent x 6 mths, pt reports L sided CP onset this Saturday, pt reports using Cocaine this Saturday, x2 vomiting episodes last night, reports generalized intermittent body tingling, denies SOB, A&O x4

## 2017-03-18 NOTE — ED Provider Notes (Signed)
[facility]-EMERGENCY DEPT Provider Note   CSN: [id number] Arrival date & time: 03/18/17  1144  By signing my name below, I, Phillips Climes, attest that this documentation has been prepared under the direction and in the presence of No att. providers found.  Electronically Signed: Phillips Climes, Scribe. 03/18/2017. 1:53 PM.  History   Chief Complaint Chief Complaint  Patient presents with  . Chest Pain   Jermaine Summers is a 31 y.o. male with no pertinent PMHx who presents to the Emergency Department with complaints of intermittent episodes of chest pain, described as pressure, with associated dyspnea. Pain radiates to back. These episodes last approximately 30 minutes and resolve with no symptomatic management. No notable triggers. Pt additionally endorses palpations. Sx are presently resolved. Pt endorses occasional cocaine use.  Pt denies experiencing any other acute sx, including fevers, chills or coughing.   No cardiac FMHx. Pt was seen for similar sx on 08/02/2015 and d/c home with a sx of musculoskeletal CP.   The history is provided by the patient and medical records. No language interpreter was used.   History reviewed. No pertinent past medical history.  There are no active problems to display for this patient.   History reviewed. No pertinent surgical history.     Home Medications    Prior to Admission medications   Medication Sig Start Date End Date Taking? Authorizing Provider  diphenhydrAMINE (SOMINEX) 25 MG tablet Take 25 mg by mouth every 12 (twelve) hours as needed for itching or sleep.    Historical Provider, MD  EPINEPHrine 0.3 mg/0.3 mL IJ SOAJ injection Inject 0.3 mLs (0.3 mg total) into the muscle once. 07/31/15   Geoffery Lyons, MD  ibuprofen (ADVIL,MOTRIN) 800 MG tablet Take 1 tablet (800 mg total) by mouth 3 (three) times daily. 08/02/15   Barrett Henle, PA-C  predniSONE (DELTASONE) 10 MG tablet Take 2 tablets (20 mg total) by mouth 2 (two) times  daily. 07/31/15   Geoffery Lyons, MD    Family History No family history on file.  Social History Social History  Substance Use Topics  . Smoking status: Current Some Day Smoker  . Smokeless tobacco: Never Used  . Alcohol use Yes     Comment: occ     Allergies   Bee venom   Review of Systems Review of Systems  Constitutional: Negative for chills and fever.  Respiratory: Positive for chest tightness and shortness of breath. Negative for cough.   Cardiovascular: Positive for chest pain and palpitations. Negative for leg swelling.     Physical Exam Updated Vital Signs BP (!) 145/94 (BP Location: Right Arm)   Pulse 97   Temp 98.1 F (36.7 C) (Oral)   Resp 20   SpO2 98%   Physical Exam  Constitutional: He is oriented to person, place, and time. He appears well-developed and well-nourished. No distress.  HENT:  Head: Normocephalic and atraumatic.  Eyes: Conjunctivae are normal.  Cardiovascular: Normal rate.   Pulmonary/Chest: Effort normal.  Abdominal: He exhibits no distension.  Musculoskeletal:  No peripheral edema  Neurological: He is alert and oriented to person, place, and time.  Skin: Skin is warm and dry.  Psychiatric: He has a normal mood and affect.  Nursing note and vitals reviewed.   ED Treatments / Results  DIAGNOSTIC STUDIES: Oxygen Saturation is 98% on room air, normal by my interpretation.    COORDINATION OF CARE:  1:48 PM Discussed treatment plan with pt at bedside and pt agreed to plan. Discussed  possible need for f/u with PCP or cardiology if sx persist.   Labs (all labs ordered are listed, but only abnormal results are displayed) Labs Reviewed  BASIC METABOLIC PANEL - Abnormal; Notable for the following:       Result Value   Sodium 134 (*)    Chloride 99 (*)    Glucose, Bld 109 (*)    All other components within normal limits  CBC - Abnormal; Notable for the following:    WBC 10.6 (*)    Hemoglobin 17.8 (*)    All other components  within normal limits  I-STAT TROPOININ, ED    EKG  EKG Interpretation  Date/Time:  Monday March 18 2017 11:48:21 EDT Ventricular Rate:  109 PR Interval:  132 QRS Duration: 82 QT Interval:  314 QTC Calculation: 422 R Axis:   58 Text Interpretation:  Sinus tachycardia Otherwise normal ECG Confirmed by Rubin Payor  MD, Harrold Donath [phone]) on 03/18/2017 1:20:15 PM       Radiology Dg Chest 2 View  Result Date: 03/18/2017 CLINICAL DATA:  31 year old with acute onset of severe left-sided chest pain and shortness of breath that began yesterday. EXAM: CHEST  2 VIEW COMPARISON:  02/15/2016, 08/02/2015, 08/01/2015. FINDINGS: Cardiomediastinal silhouette unremarkable, unchanged. Lungs clear. Bronchovascular markings normal. Pulmonary vascularity normal. No visible pleural effusions. No pneumothorax. Visualized bony thorax intact. No interval change. IMPRESSION: Normal examination. Electronically Signed   By: Hulan Saas M.D.   On: 03/18/2017 13:09    Procedures Procedures (including critical care time)  Medications Ordered in ED Medications - No data to display   Initial Impression / Assessment and Plan / ED Course  I have reviewed the triage vital signs and the nursing notes.  Pertinent labs & imaging results that were available during my care of the patient were reviewed by me and considered in my medical decision making (see chart for details).     Patient with chest pain. Episodic. Also has occasional tingling in his left hand. EKG and enzymes reassuring. Has had cocaine use a few days ago. Doubt stroke. Doubt acute ischemia. Discharge home.  Final Clinical Impressions(s) / ED Diagnoses   Final diagnoses:  Nonspecific chest pain  Palpitations    New Prescriptions Discharge Medication List as of 03/18/2017  1:54 PM     I personally performed the services described in this documentation, which was scribed in my presence. The recorded information has been reviewed and is  accurate.      Benjiman Core, MD 03/18/17 2022

## 2017-03-27 NOTE — Progress Notes (Deleted)
Cardiology Office Note   Date:  03/27/2017   ID:  Jermaine Summers, DOB [birth year], MRN [id number]  PCP:  Patient, No Pcp Per  Cardiologist:   Rollene Rotunda, MD  Referring:  ***  No chief complaint on file.     History of Present Illness: Jermaine Summers is a 31 y.o. male who presents for evaluation of chest pain a palpitations.  ***     No past medical history on file.  No past surgical history on file.   Current Outpatient Prescriptions  Medication Sig Dispense Refill  . diphenhydrAMINE (SOMINEX) 25 MG tablet Take 25 mg by mouth every 12 (twelve) hours as needed for itching or sleep.    Marland Kitchen EPINEPHrine 0.3 mg/0.3 mL IJ SOAJ injection Inject 0.3 mLs (0.3 mg total) into the muscle once. 1 Device 0  . ibuprofen (ADVIL,MOTRIN) 800 MG tablet Take 1 tablet (800 mg total) by mouth 3 (three) times daily. 21 tablet 0  . predniSONE (DELTASONE) 10 MG tablet Take 2 tablets (20 mg total) by mouth 2 (two) times daily. 12 tablet 0   No current facility-administered medications for this visit.     Allergies:   Bee venom    Social History:  The patient  reports that he has been smoking.  He has never used smokeless tobacco. He reports that he drinks alcohol. He reports that he uses drugs, including Marijuana and Cocaine.   Family History:  The patient's ***family history is not on file.    ROS:  Please see the history of present illness.   Otherwise, review of systems are positive for {NONE DEFAULTED:18576::"none"}.   All other systems are reviewed and negative.    PHYSICAL EXAM: VS:  There were no vitals taken for this visit. , BMI There is no height or weight on file to calculate BMI. GENERAL:  Well appearing HEENT:  Pupils equal round and reactive, fundi not visualized, oral mucosa unremarkable NECK:  No jugular venous distention, waveform within normal limits, carotid upstroke brisk and symmetric, no bruits, no thyromegaly LYMPHATICS:  No cervical, inguinal adenopathy LUNGS:   Clear to auscultation bilaterally BACK:  No CVA tenderness CHEST:  Unremarkable HEART:  PMI not displaced or sustained,S1 and S2 within normal limits, no S3, no S4, no clicks, no rubs, *** murmurs ABD:  Flat, positive bowel sounds normal in frequency in pitch, no bruits, no rebound, no guarding, no midline pulsatile mass, no hepatomegaly, no splenomegaly EXT:  2 plus pulses throughout, no edema, no cyanosis no clubbing SKIN:  No rashes no nodules NEURO:  Cranial nerves II through XII grossly intact, motor grossly intact throughout PSYCH:  Cognitively intact, oriented to person place and time    EKG:  EKG {ACTION; IS/IS QMV:[id number]} ordered today. The ekg ordered today demonstrates ***   Recent Labs: 03/18/2017: BUN 10; Creatinine, Ser 1.04; Hemoglobin 17.8; Platelets 238; Potassium 3.8; Sodium 134    Lipid Panel No results found for: CHOL, TRIG, HDL, CHOLHDL, VLDL, LDLCALC, LDLDIRECT    Wt Readings from Last 3 Encounters:  08/01/15 192 lb (87.1 kg)  07/30/15 195 lb (88.5 kg)      Other studies Reviewed: Additional studies/ records that were reviewed today include: ***. Review of the above records demonstrates:  Please see elsewhere in the note.  ***   ASSESSMENT AND PLAN:  ***   Current medicines are reviewed at length with the patient today.  The patient {ACTIONS; HAS/DOES NOT HAVE:19233} concerns regarding medicines.  The following changes have been  made:  {PLAN; NO CHANGE:13088:s}  Labs/ tests ordered today include: *** No orders of the defined types were placed in this encounter.    Disposition:   FU with ***    Signed, Rollene RotundaJames Renso Swett, MD  03/27/2017 7:30 AM    [facility]

## 2017-03-29 ENCOUNTER — Ambulatory Visit: Payer: Self-pay | Admitting: Cardiology

## 2022-03-03 ENCOUNTER — Emergency Department (HOSPITAL_COMMUNITY): Payer: Self-pay

## 2022-03-03 ENCOUNTER — Emergency Department (HOSPITAL_COMMUNITY)
Admission: EM | Admit: 2022-03-03 | Discharge: 2022-03-03 | Discharge status: Against Medical Advice | Payer: Self-pay | Attending: Emergency Medicine | Admitting: Emergency Medicine

## 2022-03-03 ENCOUNTER — Other Ambulatory Visit: Payer: Self-pay

## 2022-03-03 ENCOUNTER — Encounter (HOSPITAL_COMMUNITY): Payer: Self-pay | Admitting: Emergency Medicine

## 2022-03-03 DIAGNOSIS — Z5321 Procedure and treatment not carried out due to patient leaving prior to being seen by health care provider: Secondary | ICD-10-CM | POA: Insufficient documentation

## 2022-03-03 DIAGNOSIS — R0789 Other chest pain: Secondary | ICD-10-CM | POA: Insufficient documentation

## 2022-03-03 DIAGNOSIS — R202 Paresthesia of skin: Secondary | ICD-10-CM | POA: Insufficient documentation

## 2022-03-03 LAB — CBC
HCT: 48.1 % (ref 39.0–52.0)
Hemoglobin: 16.7 g/dL (ref 13.0–17.0)
MCH: 30.4 pg (ref 26.0–34.0)
MCHC: 34.7 g/dL (ref 30.0–36.0)
MCV: 87.6 fL (ref 80.0–100.0)
Platelets: 280 10*3/uL (ref 150–400)
RBC: 5.49 MIL/uL (ref 4.22–5.81)
RDW: 12.5 % (ref 11.5–15.5)
WBC: 9.6 10*3/uL (ref 4.0–10.5)
nRBC: 0 % (ref 0.0–0.2)

## 2022-03-03 LAB — BASIC METABOLIC PANEL
Anion gap: 7 (ref 5–15)
BUN: 10 mg/dL (ref 6–20)
CO2: 22 mmol/L (ref 22–32)
Calcium: 9 mg/dL (ref 8.9–10.3)
Chloride: 105 mmol/L (ref 98–111)
Creatinine, Ser: 0.81 mg/dL (ref 0.61–1.24)
GFR, Estimated: >60 mL/min (ref >60)
Glucose, Bld: 112 mg/dL — ABNORMAL HIGH (ref 70–99)
Potassium: 3.4 mmol/L — ABNORMAL LOW (ref 3.5–5.1)
Sodium: 134 mmol/L — ABNORMAL LOW (ref 135–145)

## 2022-03-03 LAB — TROPONIN I (HIGH SENSITIVITY): Troponin I (High Sensitivity): 4 ng/L (ref <18)

## 2022-03-03 NOTE — ED Notes (Signed)
Called for pt still not answer. ?

## 2022-03-03 NOTE — ED Triage Notes (Addendum)
Pt to triage via [facility] from home. Reports intermittent chest pain 1 time per week for the past several months.  Pain constant and worse since yesterday.  States pain is mainly on the left side but sometimes on the right.  Also reports tingling to L arm.  Denies SOB, nausea, and vomiting. ? ? ?EMS- ?BP 170/140 ?HR 130 ?92% on RA- placed on 2 liters by EMS ?CBG 104 ?20g L AC ?ASA 324mg  and 1 NTG SL without changes ? ?

## 2022-03-03 NOTE — ED Provider Triage Note (Signed)
Emergency Medicine Provider Triage Evaluation Note ? ?Jermaine Summers , a 36 y.o. adult  was evaluated in triage.  Pt complains of intermittent chest pain for the past 2 months.  Says that it comes on and feels like a burning and he sometimes feels it in his left hand.  No known medical conditions. ? ?Review of Systems  ?Positive: Chest pain ?Negative: Shortness of breath, palpitations or dizziness ? ?Physical Exam  ?BP (!) 145/86 (BP Location: Right Arm)   Pulse (!) 107   Temp 99 ?F (37.2 ?C) (Oral)   Resp 16   SpO2 96%  ?Gen:   Awake, no distress   ?Resp:  Normal effort  ?MSK:   Moves extremities without difficulty  ?Other:  RRR, CTA B ? ?Medical Decision Making  ?Medically screening exam initiated at 9:18 AM.  Appropriate orders placed.  Jermaine Summers was informed that the remainder of the evaluation will be completed by another provider, this initial triage assessment does not replace that evaluation, and the importance of remaining in the ED until their evaluation is complete. ? ? ?  ?Jermaine Hammock, PA-C ?03/03/22 0919 ? ?

## 2022-03-03 NOTE — ED Notes (Signed)
Pt not answering to be taken to a room ? ?
